# Patient Record
Sex: Female | Born: 1990 | Race: White | Hispanic: No | Marital: Single | Smoking: Never smoker
Health system: Southern US, Community
[De-identification: ages and names within clinical notes are randomized; demographics above are authoritative.]

## PROBLEM LIST (undated history)

## (undated) HISTORY — PX: TONSILLECTOMY: SUR1361

---

## 2013-09-23 ENCOUNTER — Emergency Department (INDEPENDENT_AMBULATORY_CARE_PROVIDER_SITE_OTHER): Payer: PRIVATE HEALTH INSURANCE

## 2013-09-23 ENCOUNTER — Emergency Department
Admission: EM | Admit: 2013-09-23 | Discharge: 2013-09-23 | Disposition: A | Discharge status: Home / Self Care | Payer: PRIVATE HEALTH INSURANCE | Source: Home / Self Care | Attending: Family Medicine | Admitting: Family Medicine

## 2013-09-23 ENCOUNTER — Encounter: Payer: Self-pay | Admitting: Emergency Medicine

## 2013-09-23 DIAGNOSIS — M25579 Pain in unspecified ankle and joints of unspecified foot: Secondary | ICD-10-CM

## 2013-09-23 DIAGNOSIS — M7989 Other specified soft tissue disorders: Secondary | ICD-10-CM

## 2013-09-23 DIAGNOSIS — S93409A Sprain of unspecified ligament of unspecified ankle, initial encounter: Secondary | ICD-10-CM

## 2013-09-23 MED ORDER — MELOXICAM 15 MG PO TABS: 15.0000 mg | ORAL_TABLET | Freq: Every day | ORAL | Status: AC

## 2013-09-23 NOTE — ED Notes (Signed)
Kelly Weeks was on a [place]ud Run/5 K and she tripped during the race but continued on the run. She has pain in her left ankle and left foot. She reports the pain as a throbbing 6/10 pain.

## 2013-09-23 NOTE — ED Provider Notes (Signed)
CSN: [id number]     Arrival date & time 09/23/13  1517 History   First MD Initiated Contact with Patient 09/23/13 1517     Chief Complaint  Patient presents with  . Foot Injury  . Ankle Injury    HPI   presents with left ankle pain. Onset of the symptoms was yesterday. Inciting event: injured while doing mud run . Current symptoms include: ability to bear weight, but with some pain, bruising, stiffness and swelling. Aggravating factors: running and standing. Symptoms have stabilized. Patient has had no prior ankle problems. Evaluation to date: none. Treatment to date: avoidance of offending activity.    History reviewed. No pertinent past medical history. Past Surgical History  Procedure Laterality Date  . Tonsillectomy     Family History  Problem Relation Age of Onset  . Cancer Mother     Lung  . Thyroid disease Mother   . Alcoholism Father   . Cirrhosis Father   . Thyroid disease Brother    History  Substance Use Topics  . Smoking status: Never Smoker   . Smokeless tobacco: Never Used  . Alcohol Use: No   OB History   Grav Para Term Preterm Abortions TAB SAB Ect Mult Living                 Review of Systems  Allergies  Review of patient's allergies indicates no known allergies.  Home Medications   Prior to Admission medications   Medication Sig Start Date End Date Taking? Authorizing Provider  norethindrone (MICRONOR,CAMILA,ERRIN) 0.35 MG tablet Take 1 tablet by mouth daily.   Yes Historical Provider, MD  meloxicam (MOBIC) 15 MG tablet Take 1 tablet (15 mg total) by mouth daily. 09/23/13   Doree AlbeeSteven Newton, MD   BP 109/70  Pulse 77  Temp(Src) 97.9 F (36.6 C) (Oral)  Ht 5\' 8"  (1.727 m)  Wt 175 lb (79.379 kg)  BMI 26.61 kg/m2  SpO2 98%  LMP 09/18/2013 Physical Exam  ED Course  Procedures (including critical care time) Labs Review Labs Reviewed - No data to display  Imaging Review Dg Ankle Complete Left  09/23/2013   CLINICAL DATA:  Fall, lateral  foot/ankle pain  EXAM: LEFT ANKLE COMPLETE - 3+ VIEW  COMPARISON:  None.  FINDINGS: No fracture or dislocation is seen.  The ankle mortise is intact.  The base of the fifth metatarsal is unremarkable.  Moderate soft tissue swelling along the dorsal/lateral aspect of the ankle.  IMPRESSION: No fracture or dislocation is seen.  Moderate soft tissue swelling.   Electronically Signed   By: Charline BillsSriyesh  Krishnan M.D.   On: 09/23/2013 16:00   Dg Foot Complete Left  09/23/2013   CLINICAL DATA:  Fall, lateral foot/ankle pain  EXAM: LEFT FOOT - COMPLETE 3+ VIEW  COMPARISON:  None.  FINDINGS: No fracture or dislocation is seen.  The joint spaces are preserved.  The visualized soft tissues are unremarkable.  IMPRESSION: No fracture or dislocation is seen.   Electronically Signed   By: Charline BillsSriyesh  Krishnan M.D.   On: 09/23/2013 15:59     MDM   1. Ankle sprain     Natural history and expected course discussed. Questions answered. Rest, ice, compression, elevation (RICE) therapy. Fit with ankle brace for use over next 4 weeks. NSAIDs per medication orders. Follow-up in 1-2 weeks with sports medicine.     The patient and/or caregiver has been counseled thoroughly with regard to treatment plan and/or medications prescribed including dosage, schedule, interactions, rationale  for use, and possible side effects and they verbalize understanding. Diagnoses and expected course of recovery discussed and will return if not improved as expected or if the condition worsens. Patient and/or caregiver verbalized understanding.        Doree Albee, MD 09/23/13 [phone]

## 2015-03-05 IMAGING — CR DG FOOT COMPLETE 3+V*L*
3 series · 3 of 3 positions shown · non-contrast
Comparison: None.

CLINICAL DATA: Fall, lateral foot/ankle pain

EXAM:
LEFT FOOT - COMPLETE 3+ VIEW

[view not recorded (1 of 3)]
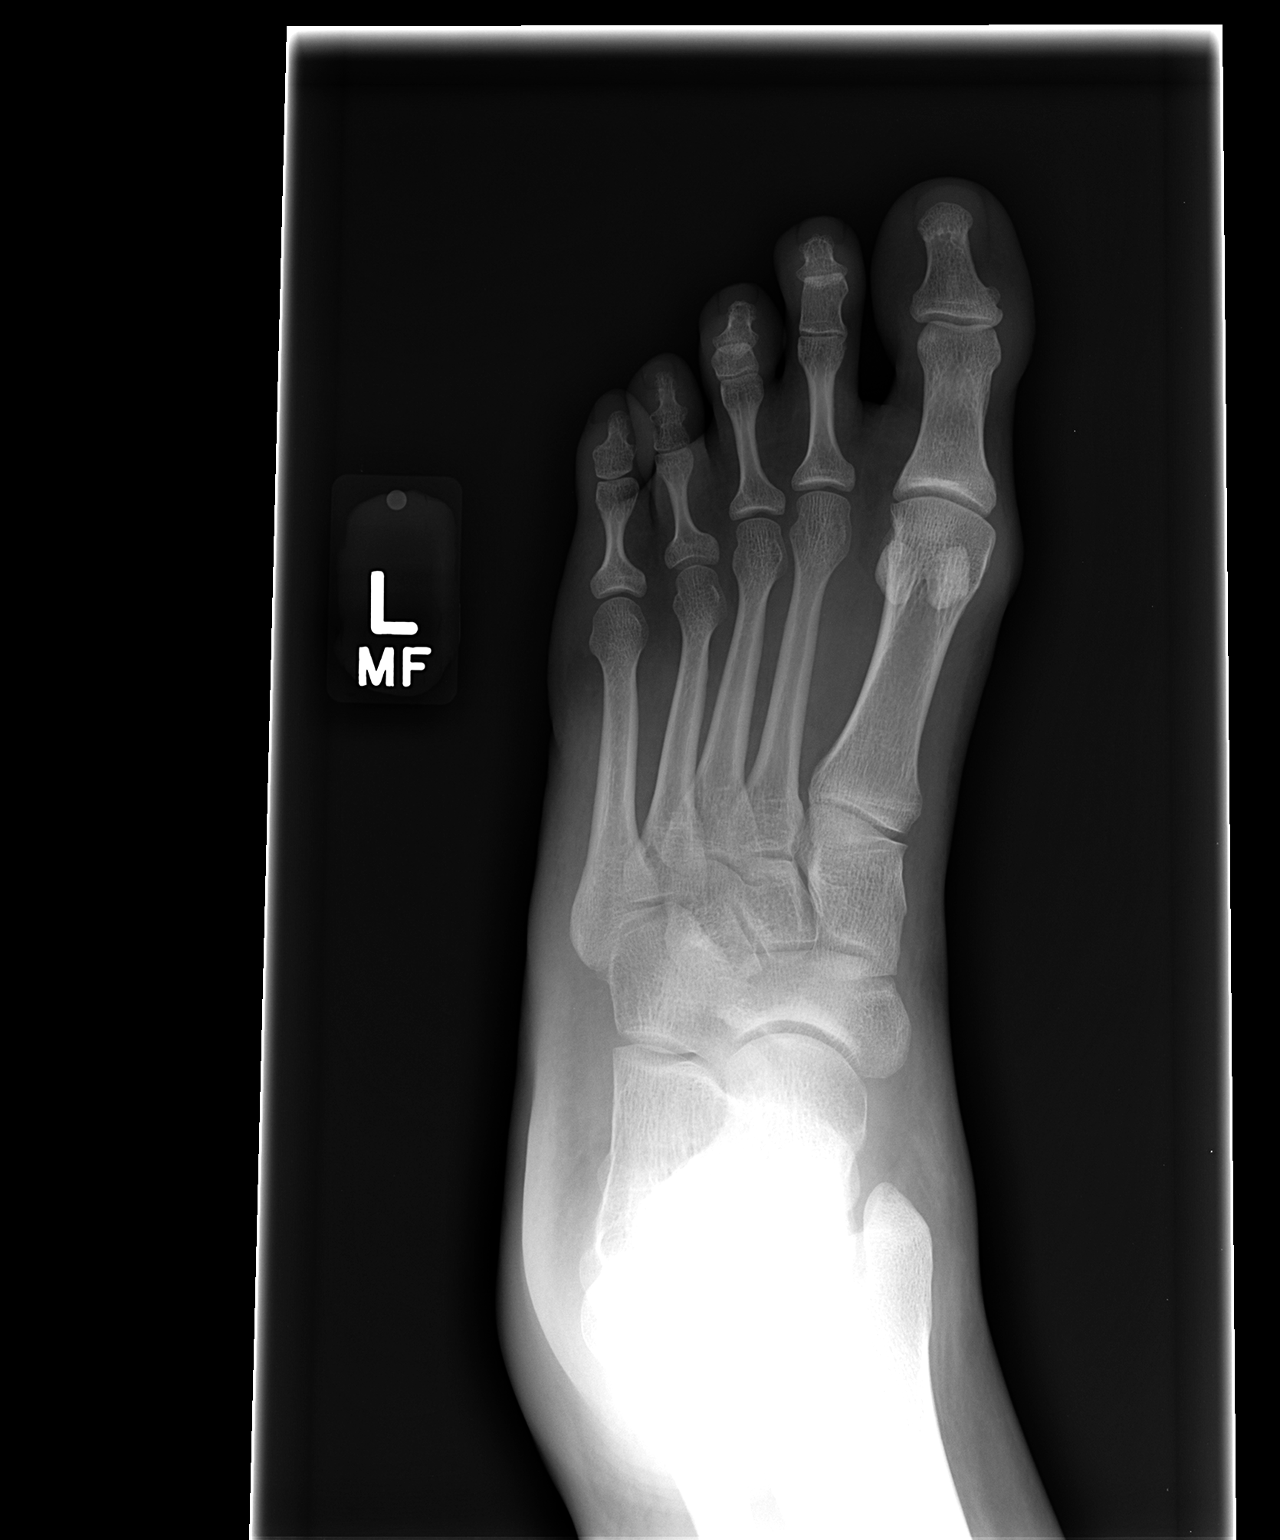

[view not recorded (2 of 3)]
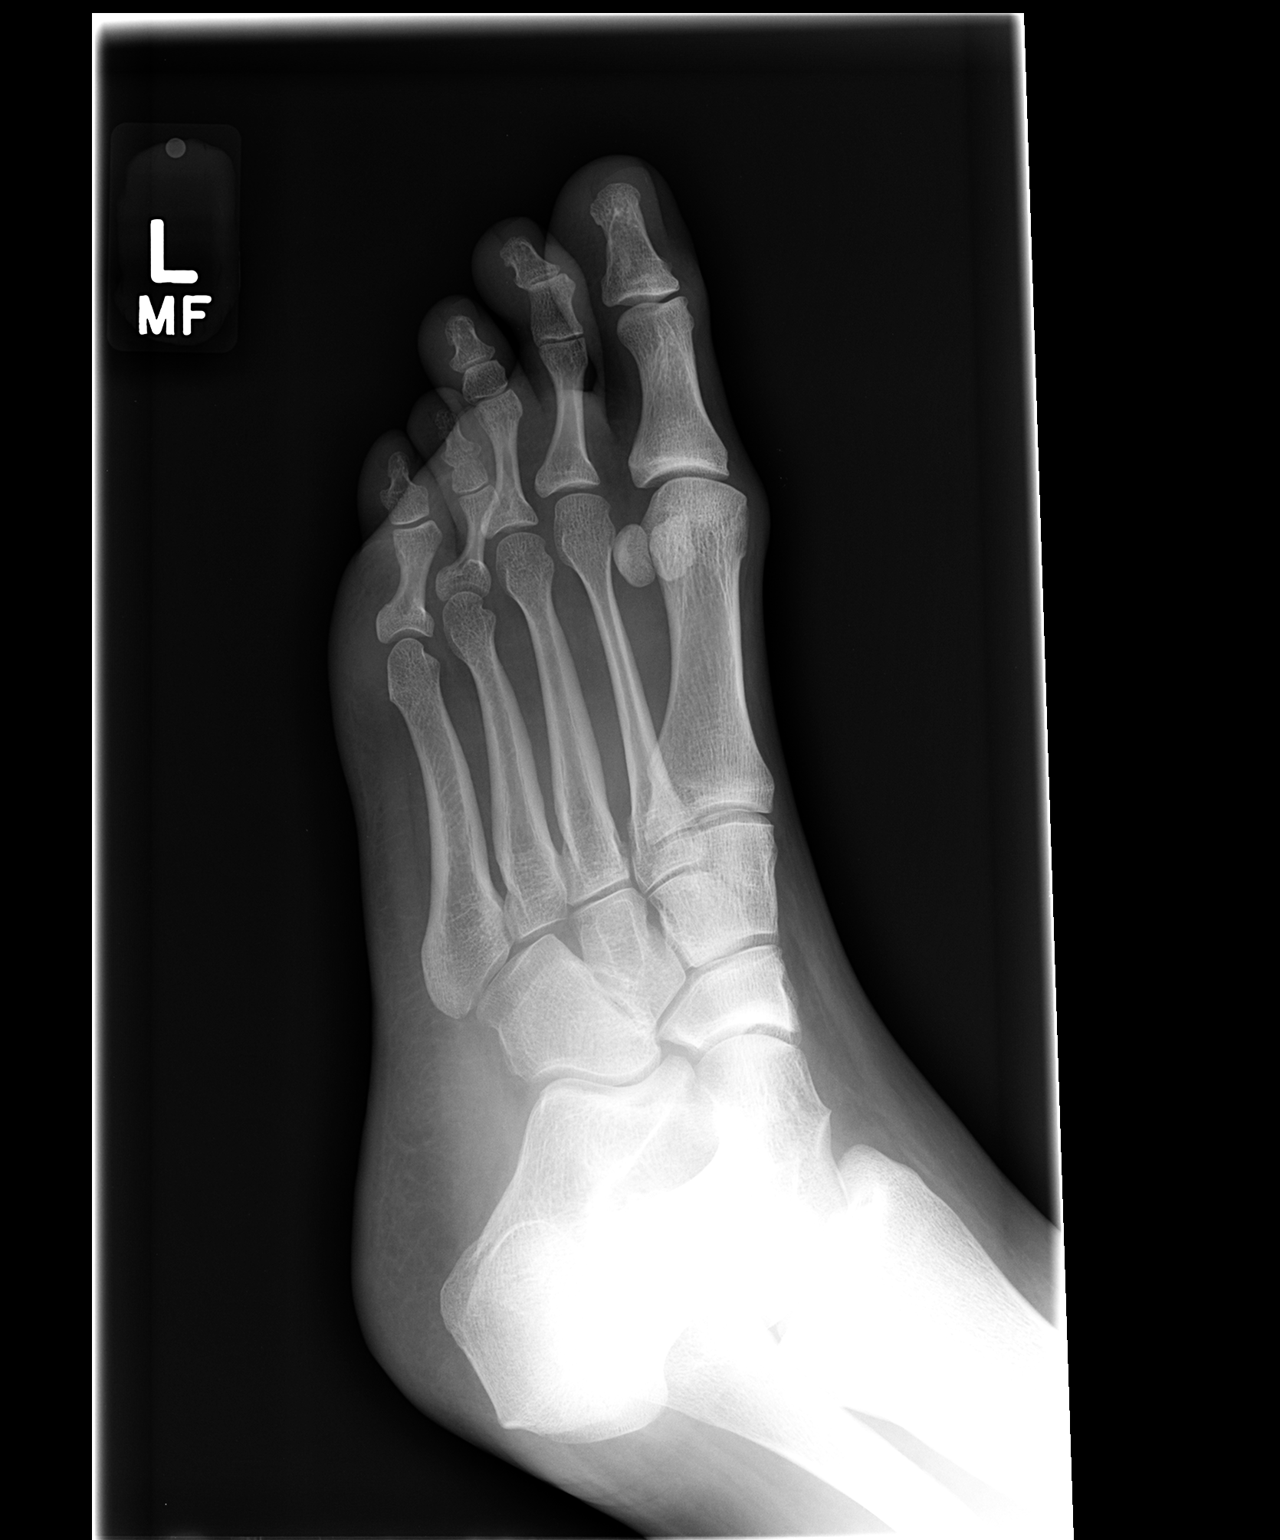

[view not recorded (3 of 3)]
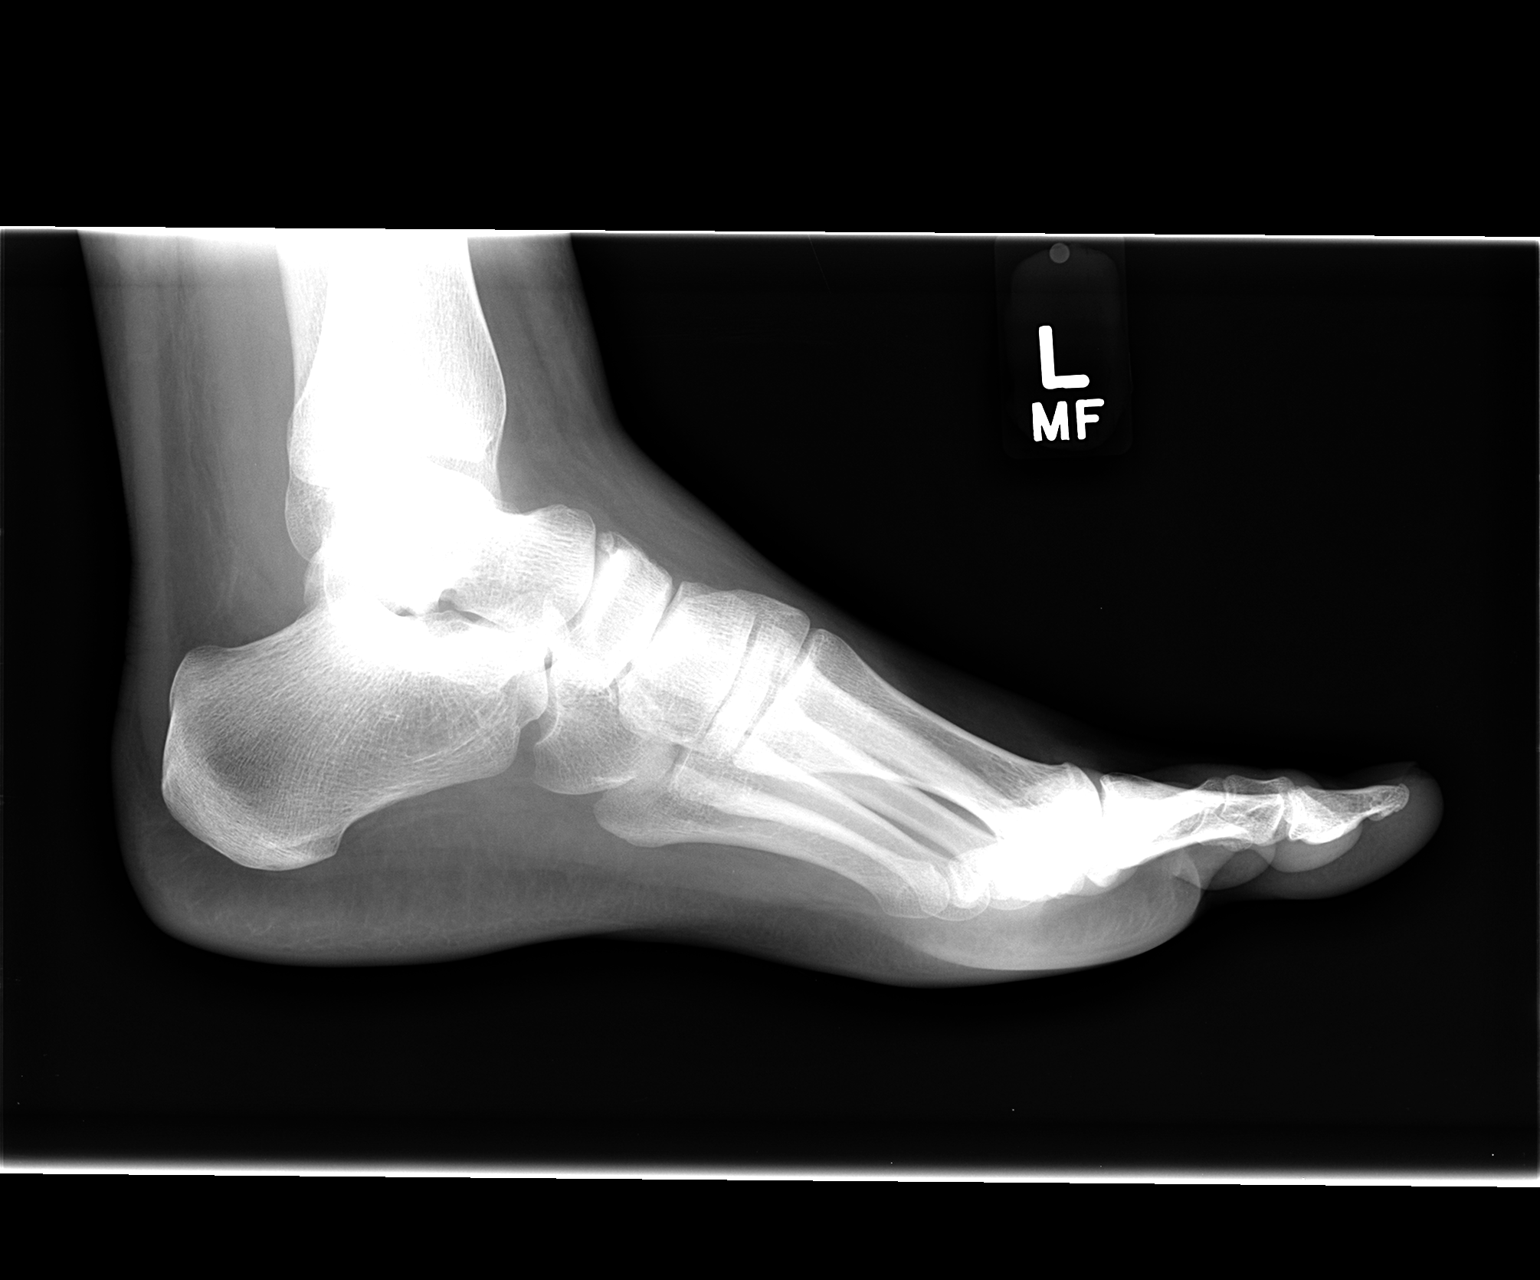

[3 of 3 positions shown; findings below may reference images not displayed]

FINDINGS: No fracture or dislocation is seen.

The joint spaces are preserved.

The visualized soft tissues are unremarkable.
IMPRESSION: No fracture or dislocation is seen.

## 2018-01-09 ENCOUNTER — Other Ambulatory Visit (HOSPITAL_COMMUNITY): Payer: Self-pay | Admitting: Internal Medicine

## 2018-01-09 DIAGNOSIS — N6310 Unspecified lump in the right breast, unspecified quadrant: Secondary | ICD-10-CM

## 2018-01-09 DIAGNOSIS — N644 Mastodynia: Secondary | ICD-10-CM
